# Patient Record
Sex: Female | Born: 1945 | Race: Asian | Hispanic: No | Marital: Single | Smoking: Never smoker
Health system: Southern US, Community
[De-identification: ages and names within clinical notes are randomized; demographics above are authoritative.]

## PROBLEM LIST (undated history)

## (undated) DIAGNOSIS — K219 Gastro-esophageal reflux disease without esophagitis: Secondary | ICD-10-CM

## (undated) DIAGNOSIS — I10 Essential (primary) hypertension: Secondary | ICD-10-CM

## (undated) HISTORY — PX: ABDOMINAL HYSTERECTOMY: SHX81

## (undated) HISTORY — PX: ECTOPIC PREGNANCY SURGERY: SHX613

## (undated) HISTORY — PX: BOWEL RESECTION: SHX1257

---

## 2014-04-23 ENCOUNTER — Ambulatory Visit: Payer: Self-pay | Admitting: Physician Assistant

## 2014-04-27 LAB — WOUND CULTURE

## 2016-12-27 DIAGNOSIS — D122 Benign neoplasm of ascending colon: Secondary | ICD-10-CM | POA: Diagnosis not present

## 2016-12-27 DIAGNOSIS — D126 Benign neoplasm of colon, unspecified: Secondary | ICD-10-CM | POA: Diagnosis not present

## 2016-12-27 DIAGNOSIS — K5730 Diverticulosis of large intestine without perforation or abscess without bleeding: Secondary | ICD-10-CM | POA: Diagnosis not present

## 2016-12-27 DIAGNOSIS — Z1211 Encounter for screening for malignant neoplasm of colon: Secondary | ICD-10-CM | POA: Diagnosis not present

## 2016-12-27 DIAGNOSIS — K621 Rectal polyp: Secondary | ICD-10-CM | POA: Diagnosis not present

## 2016-12-27 DIAGNOSIS — Z1212 Encounter for screening for malignant neoplasm of rectum: Secondary | ICD-10-CM | POA: Diagnosis not present

## 2016-12-27 DIAGNOSIS — K640 First degree hemorrhoids: Secondary | ICD-10-CM | POA: Diagnosis not present

## 2016-12-27 DIAGNOSIS — D123 Benign neoplasm of transverse colon: Secondary | ICD-10-CM | POA: Diagnosis not present

## 2017-04-02 ENCOUNTER — Encounter: Payer: Self-pay | Admitting: *Deleted

## 2017-04-02 ENCOUNTER — Ambulatory Visit
Admission: EM | Admit: 2017-04-02 | Discharge: 2017-04-02 | Disposition: A | Discharge status: Home / Self Care | Payer: Medicare Other | Attending: Family Medicine | Admitting: Family Medicine

## 2017-04-02 DIAGNOSIS — L231 Allergic contact dermatitis due to adhesives: Secondary | ICD-10-CM | POA: Diagnosis not present

## 2017-04-02 HISTORY — DX: Essential (primary) hypertension: I10

## 2017-04-02 MED ORDER — PREDNISONE 20 MG PO TABS: ORAL_TABLET | ORAL | 0 refills | Status: DC

## 2017-04-02 NOTE — ED Provider Notes (Signed)
[facility]    CSN: [id number] Arrival date & time: 04/02/17  1742     History   Chief Complaint Chief Complaint  Patient presents with  . Rash    HPI Hailey Mann is a 71 y.o. female.   The history is provided by the patient.  Rash  Location:  Torso and head/neck Head/neck rash location: posterior neck. Torso rash location:  Lower back Quality: redness and scaling   Severity:  Moderate Onset quality:  Sudden Duration:  4 days Timing:  Constant Progression:  Worsening Chronicity:  New Context: chemical exposure (symptoms started on lower back after she had used some "pain patches" she bought at the dollar store for her low back pain)   Context: not animal contact, not diapers, not eggs, not exposure to similar rash, not food, not hot tub use, not insect bite/sting, not medications, not new detergent/soap, not nuts, not plant contact, not pollen, not pregnancy, not sick contacts and not sun exposure   Relieved by:  Antihistamines (benadryl slightly) Worsened by:  Nothing Associated symptoms: no abdominal pain, no diarrhea, no fatigue, no fever, no headaches, no hoarse voice, no induration, no joint pain, no myalgias, no nausea, no periorbital edema, no shortness of breath, no sore throat, no throat swelling, no tongue swelling, no URI, not vomiting and not wheezing     Past Medical History:  Diagnosis Date  . Hypertension     There are no active problems to display for this patient.   History reviewed. No pertinent surgical history.  OB History    No data available       Home Medications    Prior to Admission medications   Medication Sig Start Date End Date Taking? Authorizing Provider  lisinopril-hydrochlorothiazide (PRINZIDE,ZESTORETIC) 10-12.5 MG tablet Take 1 tablet by mouth daily.   Yes Historical Provider, MD  verapamil (CALAN) 40 MG tablet Take 40 mg by mouth 3 (three) times daily.   Yes Historical Provider, MD  predniSONE (DELTASONE) 20  MG tablet 2 tabs po qd for 3 days, then 1 tab po qd for 3 days, then half a tab po qd for 3 days 04/02/17   Norval Gable, MD    Family History History reviewed. No pertinent family history.  Social History Social History  Substance Use Topics  . Smoking status: Never Smoker  . Smokeless tobacco: Never Used  . Alcohol use No     Allergies   Doxycycline; Neurontin [gabapentin]; and Codeine   Review of Systems Review of Systems  Constitutional: Negative for fatigue and fever.  HENT: Negative for hoarse voice and sore throat.   Respiratory: Negative for shortness of breath and wheezing.   Gastrointestinal: Negative for abdominal pain, diarrhea, nausea and vomiting.  Musculoskeletal: Negative for arthralgias and myalgias.  Skin: Positive for rash.  Neurological: Negative for headaches.     Physical Exam Triage Vital Signs ED Triage Vitals  Enc Vitals Group     BP 04/02/17 1919 (!) 159/85     Pulse Rate 04/02/17 1919 63     Resp 04/02/17 1919 16     Temp 04/02/17 1919 98.2 F (36.8 C)     Temp Source 04/02/17 1919 Oral     SpO2 04/02/17 1919 100 %     Weight 04/02/17 1922 205 lb (93 kg)     Height 04/02/17 1922 5\' 6"  (1.676 m)     Head Circumference --      Peak Flow --  Pain Score --      Pain Loc --      Pain Edu? --      Excl. in [place]? --    No data found.   Updated Vital Signs BP (!) 159/85 (BP Location: Left Arm)   Pulse 63   Temp 98.2 F (36.8 C) (Oral)   Resp 16   Ht 5\' 6"  (1.676 m)   Wt 205 lb (93 kg)   SpO2 100%   BMI 33.09 kg/m   Visual Acuity Right Eye Distance:   Left Eye Distance:   Bilateral Distance:    Right Eye Near:   Left Eye Near:    Bilateral Near:     Physical Exam  Constitutional: She appears well-developed and well-nourished. No distress.  Pulmonary/Chest: Effort normal and breath sounds normal. No respiratory distress. She has no wheezes.  Skin: Rash noted. She is not diaphoretic. There is erythema.  Scaly, slightly  erythematous patchy rash to lower back area and smaller similar rash on back of neck; no drainage, no vesicular lesion, no burrows  Nursing note and vitals reviewed.    UC Treatments / Results  Labs (all labs ordered are listed, but only abnormal results are displayed) Labs Reviewed - No data to display  EKG  EKG Interpretation None       Radiology No results found.  Procedures Procedures (including critical care time)  Medications Ordered in UC Medications - No data to display   Initial Impression / Assessment and Plan / UC Course  I have reviewed the triage vital signs and the nursing notes.  Pertinent labs & imaging results that were available during my care of the patient were reviewed by me and considered in my medical decision making (see chart for details).       Final Clinical Impressions(s) / UC Diagnoses   Final diagnoses:  Allergic contact dermatitis due to adhesives    New Prescriptions Discharge Medication List as of 04/02/2017  7:50 PM    START taking these medications   Details  predniSONE (DELTASONE) 20 MG tablet 2 tabs po qd for 3 days, then 1 tab po qd for 3 days, then half a tab po qd for 3 days, Normal       1. diagnosis reviewed with patient 2. rx as per orders above; reviewed possible side effects, interactions, risks and benefits  3. Recommend supportive treatment with otc benadryl prn itching  4. Follow-up prn if symptoms worsen or don't improve   Norval Gable, MD 04/02/17 2118

## 2017-04-02 NOTE — ED Triage Notes (Signed)
Rash to posterior neck and back x 4 days. Intense itching and pt concerned it may be spreading.

## 2017-04-22 DIAGNOSIS — I10 Essential (primary) hypertension: Secondary | ICD-10-CM | POA: Diagnosis not present

## 2017-04-22 DIAGNOSIS — N3946 Mixed incontinence: Secondary | ICD-10-CM | POA: Diagnosis not present

## 2017-04-22 DIAGNOSIS — R7303 Prediabetes: Secondary | ICD-10-CM | POA: Diagnosis not present

## 2017-04-22 DIAGNOSIS — Z23 Encounter for immunization: Secondary | ICD-10-CM | POA: Diagnosis not present

## 2017-04-22 DIAGNOSIS — K219 Gastro-esophageal reflux disease without esophagitis: Secondary | ICD-10-CM | POA: Diagnosis not present

## 2017-04-22 DIAGNOSIS — J4520 Mild intermittent asthma, uncomplicated: Secondary | ICD-10-CM | POA: Diagnosis not present

## 2017-04-22 DIAGNOSIS — Z8739 Personal history of other diseases of the musculoskeletal system and connective tissue: Secondary | ICD-10-CM | POA: Diagnosis not present

## 2017-04-22 DIAGNOSIS — E559 Vitamin D deficiency, unspecified: Secondary | ICD-10-CM | POA: Diagnosis not present

## 2017-05-23 DIAGNOSIS — N3946 Mixed incontinence: Secondary | ICD-10-CM | POA: Diagnosis not present

## 2017-05-23 DIAGNOSIS — Z780 Asymptomatic menopausal state: Secondary | ICD-10-CM | POA: Diagnosis not present

## 2017-05-23 DIAGNOSIS — E559 Vitamin D deficiency, unspecified: Secondary | ICD-10-CM | POA: Diagnosis not present

## 2017-05-23 DIAGNOSIS — Z1231 Encounter for screening mammogram for malignant neoplasm of breast: Secondary | ICD-10-CM | POA: Diagnosis not present

## 2017-05-23 DIAGNOSIS — I10 Essential (primary) hypertension: Secondary | ICD-10-CM | POA: Diagnosis not present

## 2017-07-25 DIAGNOSIS — Z13820 Encounter for screening for osteoporosis: Secondary | ICD-10-CM | POA: Diagnosis not present

## 2017-07-25 DIAGNOSIS — Z780 Asymptomatic menopausal state: Secondary | ICD-10-CM | POA: Diagnosis not present

## 2017-07-25 DIAGNOSIS — Z1231 Encounter for screening mammogram for malignant neoplasm of breast: Secondary | ICD-10-CM | POA: Diagnosis not present

## 2017-07-25 DIAGNOSIS — M8589 Other specified disorders of bone density and structure, multiple sites: Secondary | ICD-10-CM | POA: Diagnosis not present

## 2017-11-28 DIAGNOSIS — N3946 Mixed incontinence: Secondary | ICD-10-CM | POA: Diagnosis not present

## 2017-11-28 DIAGNOSIS — Z13220 Encounter for screening for lipoid disorders: Secondary | ICD-10-CM | POA: Diagnosis not present

## 2017-11-28 DIAGNOSIS — E559 Vitamin D deficiency, unspecified: Secondary | ICD-10-CM | POA: Diagnosis not present

## 2017-11-28 DIAGNOSIS — D649 Anemia, unspecified: Secondary | ICD-10-CM | POA: Diagnosis not present

## 2017-11-28 DIAGNOSIS — I10 Essential (primary) hypertension: Secondary | ICD-10-CM | POA: Diagnosis not present

## 2017-11-28 DIAGNOSIS — E785 Hyperlipidemia, unspecified: Secondary | ICD-10-CM | POA: Diagnosis not present

## 2017-11-28 DIAGNOSIS — R1013 Epigastric pain: Secondary | ICD-10-CM | POA: Diagnosis not present

## 2017-11-28 DIAGNOSIS — Z6836 Body mass index (BMI) 36.0-36.9, adult: Secondary | ICD-10-CM | POA: Diagnosis not present

## 2018-01-16 ENCOUNTER — Other Ambulatory Visit: Payer: Self-pay

## 2018-01-16 ENCOUNTER — Ambulatory Visit: Payer: Medicare Other

## 2018-01-16 ENCOUNTER — Ambulatory Visit
Admission: EM | Admit: 2018-01-16 | Discharge: 2018-01-16 | Disposition: A | Discharge status: Home / Self Care | Payer: Medicare Other | Attending: Emergency Medicine | Admitting: Emergency Medicine

## 2018-01-16 DIAGNOSIS — Z885 Allergy status to narcotic agent status: Secondary | ICD-10-CM | POA: Insufficient documentation

## 2018-01-16 DIAGNOSIS — M5136 Other intervertebral disc degeneration, lumbar region: Secondary | ICD-10-CM | POA: Diagnosis not present

## 2018-01-16 DIAGNOSIS — S73102A Unspecified sprain of left hip, initial encounter: Secondary | ICD-10-CM | POA: Diagnosis not present

## 2018-01-16 DIAGNOSIS — Z8719 Personal history of other diseases of the digestive system: Secondary | ICD-10-CM | POA: Diagnosis not present

## 2018-01-16 DIAGNOSIS — W010XXA Fall on same level from slipping, tripping and stumbling without subsequent striking against object, initial encounter: Secondary | ICD-10-CM | POA: Diagnosis not present

## 2018-01-16 DIAGNOSIS — M25552 Pain in left hip: Secondary | ICD-10-CM | POA: Diagnosis not present

## 2018-01-16 DIAGNOSIS — W19XXXA Unspecified fall, initial encounter: Secondary | ICD-10-CM | POA: Diagnosis not present

## 2018-01-16 DIAGNOSIS — I2510 Atherosclerotic heart disease of native coronary artery without angina pectoris: Secondary | ICD-10-CM | POA: Diagnosis not present

## 2018-01-16 DIAGNOSIS — S79912A Unspecified injury of left hip, initial encounter: Secondary | ICD-10-CM | POA: Diagnosis not present

## 2018-01-16 DIAGNOSIS — I10 Essential (primary) hypertension: Secondary | ICD-10-CM | POA: Insufficient documentation

## 2018-01-16 DIAGNOSIS — Z888 Allergy status to other drugs, medicaments and biological substances status: Secondary | ICD-10-CM | POA: Insufficient documentation

## 2018-01-16 DIAGNOSIS — K219 Gastro-esophageal reflux disease without esophagitis: Secondary | ICD-10-CM | POA: Insufficient documentation

## 2018-01-16 DIAGNOSIS — M545 Low back pain: Secondary | ICD-10-CM | POA: Diagnosis not present

## 2018-01-16 HISTORY — DX: Gastro-esophageal reflux disease without esophagitis: K21.9

## 2018-01-16 MED ORDER — METHOCARBAMOL 750 MG PO TABS: 750.0000 mg | ORAL_TABLET | ORAL | 0 refills | Status: DC

## 2018-01-16 NOTE — ED Provider Notes (Signed)
HPI  SUBJECTIVE:  Hailey Mann is a 72 y.o. female who presents with a slip and fall 4 days ago.  Patient states that she slipped on some mud, her left leg hyperextended she fell onto her left hip buttock.  She states that she had sharp low back pain after the fall, but that has since resolved.  She reports continued back pain but states it is not really new or different beyond her baseline.  She comes in for constant burning, left hip pain described as spasms which radiates into her groin.  She denies chest pain, shortness breath, palpitations, syncope causing the fall.  She denies head injury, neck pain.  She has been ambulatory since the fall.  She denies limitation of motion of her hip, numbness, tingling, weakness, sciatic type symptoms.  No bruising, swelling.  She has tried Tylenol and baclofen without improvement in her symptoms.  Symptoms are worse with initiating movement and with sitting for prolonged periods of time, better with walking.  She has a past medical history  Of lumbar degenerative disc disease, bleeding gastric ulcers, hypertension, coronary artery disease.  No history of osteoporosis, diabetes, cancer, multiple myeloma.  PMD: [facility] in [place], [place].    Past Medical History:  Diagnosis Date  . GERD (gastroesophageal reflux disease)   . Hypertension     Past Surgical History:  Procedure Laterality Date  . ABDOMINAL HYSTERECTOMY    . BOWEL RESECTION    . ECTOPIC PREGNANCY SURGERY      Family History  Problem Relation Age of Onset  . Gout Mother   . Kidney disease Father     Social History   Tobacco Use  . Smoking status: Never Smoker  . Smokeless tobacco: Never Used  Substance Use Topics  . Alcohol use: No  . Drug use: No    No current facility-administered medications for this encounter.   Current Outpatient Medications:  .  lisinopril-hydrochlorothiazide (PRINZIDE,ZESTORETIC) 10-12.5 MG tablet, Take 1 tablet by mouth daily., Disp: ,  Rfl:  .  methocarbamol (ROBAXIN) 750 MG tablet, Take 1 tablet (750 mg total) by mouth every 4 (four) hours., Disp: 40 tablet, Rfl: 0 .  verapamil (CALAN) 40 MG tablet, Take 40 mg by mouth 3 (three) times daily., Disp: , Rfl:   Allergies  Allergen Reactions  . Doxycycline Anaphylaxis  . Neurontin [Gabapentin] Other (See Comments)    Heavy sweating, tremors.  . Codeine Rash    With higher doses.     ROS  As noted in HPI.   Physical Exam  BP (!) 153/91 (BP Location: Left Arm)   Pulse 74   Temp 98.2 F (36.8 C) (Oral)   Resp 18   Ht _0  (1.6 m)   Wt 205 lb (93 kg)   SpO2 99%   BMI 36.31 kg/m   Constitutional: Well developed, well nourished, no acute distress Eyes:  EOMI, conjunctiva normal bilaterally HENT: Normocephalic, atraumatic,mucus membranes moist Respiratory: Normal inspiratory effort Cardiovascular: Normal rate GI: nondistended skin: No rash, skin intact Musculoskeletal: Positive L-spine bony tenderness at L4, L5.  No paralumbar tenderness.  No SI joint or sacral tenderness.  No signs of trauma L hip. No bruising, erythema, rash. Diffuse muscular tenderness over gluteal muscles. No tenderness, down IT band, quadriceps. No pain with passive abduction/adduction of leg. No pain with int/ext rotation hip.  Pain with forward flexion.  No pain with extension.  No tenderness at sciatic notch. Roll test for muscle spasm negative. Flexion/extension knee  WNL. Knee joint NT. Motor strenght flexion/ext hip 5/5. Sensation to LT intact. DP 2+ she has an antalgic gait but is able to bear weight on this hip. Neurologic: Alert & oriented x 3, no focal neuro deficits Psychiatric: Speech and behavior appropriate   ED Course   Medications - No data to display  Orders Placed This Encounter  Procedures  . DG Lumbar Spine Complete    Standing Status:   Standing    Number of Occurrences:   1    Order Specific Question:   Reason for Exam (SYMPTOM  OR DIAGNOSIS REQUIRED)    Answer:    r/o fx acute changes  . DG Hip Unilat With Pelvis 2-3 Views Left    Standing Status:   Standing    Number of Occurrences:   1    Order Specific Question:   Symptom/Reason for Exam    Answer:   Fall [[id number]]    Order Specific Question:   Radiology Contrast Protocol - do NOT remove file path    Answer:   [email or web address]    No results found for this or any previous visit (from the past 24 hour(s)). Dg Lumbar Spine Complete  Result Date: 01/16/2018 CLINICAL DATA:  Patient fell.  Low back pain. EXAM: LUMBAR SPINE - COMPLETE 4+ VIEW COMPARISON:  None. FINDINGS: Grade 1 anterolisthesis of L4 versus L5. Multilevel degenerative changes. There is an inferior osteophyte off of T12 consistent with degenerative change. No loss of height of T12 seen on the lateral view. There does appear to be mild loss of height to the side of T12 on the frontal view. No other acute abnormalities. IMPRESSION: 1. Mild loss of height to the right side of T12 only seen on the frontal view and not confirmed on the lateral view is an age-indeterminate finding. The finding may be nonacute. CT or MRI could better evaluate this level if there is clinical concern in this region. Electronically Signed   By: Dorise Bullion III M.D   On: 01/16/2018 18:35   Dg Hip Unilat With Pelvis 2-3 Views Left  Result Date: 01/16/2018 CLINICAL DATA:  Pain after fall several days ago EXAM: DG HIP (WITH OR WITHOUT PELVIS) 2-3V LEFT COMPARISON:  None. FINDINGS: There is no evidence of hip fracture or dislocation. There is no evidence of arthropathy or other focal bone abnormality. IMPRESSION: Negative. Electronically Signed   By: Dorise Bullion III M.D   On: 01/16/2018 18:35    ED Clinical Impression  Sprain of left hip, initial encounter  Fall - Plan: DG Hip Unilat With Pelvis 2-3 Views Left, DG Hip Unilat With Pelvis 2-3 Views Left  Fall, initial encounter   ED Assessment/Plan  Suspect hip  contusion with hip strain.  However she does have bony lumbar tenderness so we will get films to rule out an acute lumbar fracture.  We will also get hip/pelvis films as she does have tenderness over the greater trochanter/gluteal muscles.  However doubt displaced fracture or dislocation.  Reviewed imaging independently.  No acute changes in the L-spine or hip.  Mild loss of height to the right side of T12 seen on the frontal view, age indeterminate finding.  Per radiology.  See radiology report for details.  She does not have tenderness in this area so I am not concerned for any acute changes here today.    Plan to send home with 1 g of Tylenol 3-4 times a day as needed for pain, Robaxin instead of baclofen.  Patient declined prescription of Norco.  We will have her follow-up with her primary care physician as needed, to the ER if she gets worse.  Discussed  imaging, MDM, plan and followup with patient. Discussed sn/sx that should prompt return to the ED. patient agrees with plan.   Meds ordered this encounter  Medications  . methocarbamol (ROBAXIN) 750 MG tablet    Sig: Take 1 tablet (750 mg total) by mouth every 4 (four) hours.    Dispense:  40 tablet    Refill:  0    *This clinic note was created using [employer]. Therefore, there may be occasional mistakes despite careful proofreading.   ?   Melynda Ripple, MD 01/16/18 [phone]

## 2018-01-16 NOTE — Discharge Instructions (Signed)
1 g of Tylenol 3-4 times a day as needed for pain.  Try the Robaxin instead of the baclofen.  X-rays were negative for any acute fractures today.

## 2018-01-16 NOTE — ED Triage Notes (Signed)
Pt was working in the yard on Monday and injured her left low back/left hip area. Pain now into groin area and left hip/buttock. Pain 8/10 with limping gait.

## 2018-01-22 ENCOUNTER — Telehealth: Payer: Self-pay | Admitting: Emergency Medicine

## 2018-01-22 MED ORDER — METHOCARBAMOL 750 MG PO TABS: 750.0000 mg | ORAL_TABLET | ORAL | 0 refills | Status: DC

## 2018-02-25 DIAGNOSIS — M62830 Muscle spasm of back: Secondary | ICD-10-CM | POA: Diagnosis not present

## 2018-02-25 DIAGNOSIS — I10 Essential (primary) hypertension: Secondary | ICD-10-CM | POA: Diagnosis not present

## 2018-02-25 DIAGNOSIS — I517 Cardiomegaly: Secondary | ICD-10-CM | POA: Diagnosis not present

## 2018-02-25 DIAGNOSIS — R110 Nausea: Secondary | ICD-10-CM | POA: Diagnosis not present

## 2018-02-25 DIAGNOSIS — Z87891 Personal history of nicotine dependence: Secondary | ICD-10-CM | POA: Diagnosis not present

## 2018-02-25 DIAGNOSIS — E785 Hyperlipidemia, unspecified: Secondary | ICD-10-CM | POA: Diagnosis not present

## 2018-02-25 DIAGNOSIS — I440 Atrioventricular block, first degree: Secondary | ICD-10-CM | POA: Diagnosis not present

## 2018-02-25 DIAGNOSIS — Z7982 Long term (current) use of aspirin: Secondary | ICD-10-CM | POA: Diagnosis not present

## 2018-02-25 DIAGNOSIS — H8149 Vertigo of central origin, unspecified ear: Secondary | ICD-10-CM | POA: Diagnosis not present

## 2018-02-25 DIAGNOSIS — J45909 Unspecified asthma, uncomplicated: Secondary | ICD-10-CM | POA: Diagnosis not present

## 2018-02-25 DIAGNOSIS — R109 Unspecified abdominal pain: Secondary | ICD-10-CM | POA: Diagnosis not present

## 2018-02-25 DIAGNOSIS — R531 Weakness: Secondary | ICD-10-CM | POA: Diagnosis not present

## 2018-02-25 DIAGNOSIS — Z5181 Encounter for therapeutic drug level monitoring: Secondary | ICD-10-CM | POA: Diagnosis not present

## 2018-02-25 DIAGNOSIS — E119 Type 2 diabetes mellitus without complications: Secondary | ICD-10-CM | POA: Diagnosis not present

## 2018-02-25 DIAGNOSIS — Z79899 Other long term (current) drug therapy: Secondary | ICD-10-CM | POA: Diagnosis not present

## 2018-03-02 DIAGNOSIS — R937 Abnormal findings on diagnostic imaging of other parts of musculoskeletal system: Secondary | ICD-10-CM | POA: Diagnosis not present

## 2018-03-02 DIAGNOSIS — M5432 Sciatica, left side: Secondary | ICD-10-CM | POA: Diagnosis not present

## 2018-03-02 DIAGNOSIS — Y92009 Unspecified place in unspecified non-institutional (private) residence as the place of occurrence of the external cause: Secondary | ICD-10-CM | POA: Diagnosis not present

## 2018-03-02 DIAGNOSIS — W19XXXD Unspecified fall, subsequent encounter: Secondary | ICD-10-CM | POA: Diagnosis not present

## 2018-03-16 DIAGNOSIS — M5432 Sciatica, left side: Secondary | ICD-10-CM | POA: Diagnosis not present

## 2018-03-16 DIAGNOSIS — R937 Abnormal findings on diagnostic imaging of other parts of musculoskeletal system: Secondary | ICD-10-CM | POA: Diagnosis not present

## 2018-03-16 DIAGNOSIS — M47816 Spondylosis without myelopathy or radiculopathy, lumbar region: Secondary | ICD-10-CM | POA: Diagnosis not present

## 2018-09-22 NOTE — Telephone Encounter (Signed)
To close

## 2018-11-27 ENCOUNTER — Other Ambulatory Visit: Payer: Self-pay

## 2018-11-27 ENCOUNTER — Ambulatory Visit
Admission: EM | Admit: 2018-11-27 | Discharge: 2018-11-27 | Disposition: A | Discharge status: Home / Self Care | Payer: Medicare Other | Attending: Family Medicine | Admitting: Family Medicine

## 2018-11-27 ENCOUNTER — Encounter: Payer: Self-pay | Admitting: Emergency Medicine

## 2018-11-27 DIAGNOSIS — B9789 Other viral agents as the cause of diseases classified elsewhere: Secondary | ICD-10-CM | POA: Diagnosis not present

## 2018-11-27 DIAGNOSIS — J069 Acute upper respiratory infection, unspecified: Secondary | ICD-10-CM | POA: Diagnosis not present

## 2018-11-27 DIAGNOSIS — J45901 Unspecified asthma with (acute) exacerbation: Secondary | ICD-10-CM | POA: Insufficient documentation

## 2018-11-27 MED ORDER — ALBUTEROL SULFATE HFA 108 (90 BASE) MCG/ACT IN AERS: 1.0000 | INHALATION_SPRAY | Freq: Four times a day (QID) | RESPIRATORY_TRACT | 0 refills | Status: DC | PRN

## 2018-11-27 MED ORDER — BENZONATATE 100 MG PO CAPS: 100.0000 mg | ORAL_CAPSULE | Freq: Three times a day (TID) | ORAL | 0 refills | Status: DC | PRN

## 2018-11-27 MED ORDER — PREDNISONE 50 MG PO TABS: ORAL_TABLET | ORAL | 0 refills | Status: DC

## 2018-11-27 NOTE — ED Triage Notes (Signed)
Patient c/o cough and congestion that started on Monday. Patient states she has left side rib pain with coughing and deep inspiration. Patient denies fever. States she has taken OTC Theraflu and Dayquil for her symptoms.

## 2018-11-27 NOTE — Discharge Instructions (Signed)
Meds as prescribed. ° °Take care ° °Dr. Ivie Maese  °

## 2018-11-27 NOTE — ED Provider Notes (Signed)
[facility]    CSN: [id number] Arrival date & time: 11/27/18  4315  History   Chief Complaint Chief Complaint  Patient presents with  . Cough  . Muscle Pain   HPI   72 year old female presents with the above complaint.  Patient reports that she cough and congestion on Monday.  Has a history of asthma.  She reports associated left lower rib pain.  Pain with deep inspiration.  No fever.  No chills.  She has been taking over-the-counter medication without resolution.  Symptoms are worse at night.  No known relieving factors.  No other associated symptoms.  PMH, Surgical Hx, Family Hx, Social History reviewed and updated as below.  PMH: Hyperlipidemia LDL goal <130, unspecified 12/02/2017  Overview:   Formatting of this note might be different from the original. Previously on pravastatin, unclear with d/c Restarted 12/02/17 for ASCVD risk of 13.9%  Last Lipids: Lab Results  Component Value Date  CHOLTOTAL 208 11/28/2017  [place] 132 11/28/2017  HDL 65 11/28/2017  TRIG 56 11/28/2017     Anemia 12/02/2017  BMI 36.0-36.9,adult 11/28/2017  Intermittent epigastric abdominal pain 11/28/2017  Mild intermittent asthma without complication 40/07/6760  Tubular adenoma 04/22/2017  Pre-diabetes 04/22/2017  Urge and stress incontinence 04/22/2017  Vitamin D insufficiency 04/22/2017  History of low back pain 04/22/2017  Essential hypertension 06/08/2015  Generalized osteoarthritis 06/08/2015  GERD without esophagitis 06/08/2015  Chest pain 06/07/2015    Past Surgical History:  Procedure Laterality Date  . ABDOMINAL HYSTERECTOMY    . BOWEL RESECTION    . ECTOPIC PREGNANCY SURGERY      OB History   No obstetric history on file.      Home Medications    Prior to Admission medications   Medication Sig Start Date End Date Taking? Authorizing Provider  lisinopril-hydrochlorothiazide (PRINZIDE,ZESTORETIC) 10-12.5 MG tablet Take 1 tablet by mouth daily.   Yes  [provider]  verapamil (CALAN) 40 MG tablet Take 40 mg by mouth 3 (three) times daily.   Yes [provider]  albuterol (PROVENTIL HFA;VENTOLIN HFA) 108 (90 Base) MCG/ACT inhaler Inhale 1-2 puffs into the lungs every 6 (six) hours as needed for wheezing or shortness of breath. 11/27/18   Coral Spikes, DO  benzonatate (TESSALON) 100 MG capsule Take 1 capsule (100 mg total) by mouth 3 (three) times daily as needed. 11/27/18   Coral Spikes, DO  predniSONE (DELTASONE) 50 MG tablet 1 tablet daily x 5 days 11/27/18   Coral Spikes, DO    Family History Family History  Problem Relation Age of Onset  . Gout Mother   . Kidney disease Father     Social History Social History   Tobacco Use  . Smoking status: Never Smoker  . Smokeless tobacco: Never Used  Substance Use Topics  . Alcohol use: No  . Drug use: No     Allergies   Doxycycline; Neurontin [gabapentin]; and Codeine   Review of Systems Review of Systems  Constitutional: Negative for fever.  HENT: Positive for congestion.   Respiratory: Positive for cough.     Physical Exam Triage Vital Signs ED Triage Vitals [11/27/18 1014]  Enc Vitals Group     BP (!) 177/101     Pulse Rate 75     Resp 18     Temp 98.6 F (37 C)     Temp Source Oral     SpO2 99 %     Weight 205 lb (93 kg)  Height 5\' 5"  (1.651 m)     Head Circumference      Peak Flow      Pain Score 7     Pain Loc      Pain Edu?      Excl. in [place]?    Updated Vital Signs BP (!) 177/101 (BP Location: Left Arm)   Pulse 75   Temp 98.6 F (37 C) (Oral)   Resp 18   Ht 5\' 5"  (1.651 m)   Wt 93 kg   SpO2 99%   BMI 34.11 kg/m   Visual Acuity Right Eye Distance:   Left Eye Distance:   Bilateral Distance:    Right Eye Near:   Left Eye Near:    Bilateral Near:     Physical Exam Vitals signs and nursing note reviewed.  Constitutional:      General: She is not in acute distress. HENT:     Head: Normocephalic and atraumatic.      Right Ear: Tympanic membrane normal.     Left Ear: Tympanic membrane normal.     Mouth/Throat:     Pharynx: Oropharynx is clear. No posterior oropharyngeal erythema.  Eyes:     General:        Right eye: No discharge.        Left eye: No discharge.     Conjunctiva/sclera: Conjunctivae normal.  Cardiovascular:     Rate and Rhythm: Normal rate and regular rhythm.  Pulmonary:     Effort: Pulmonary effort is normal. No respiratory distress.     Breath sounds: Wheezing present.  Neurological:     Mental Status: She is alert.  Psychiatric:        Mood and Affect: Mood normal.        Behavior: Behavior normal.    UC Treatments / Results  Labs (all labs ordered are listed, but only abnormal results are displayed) Labs Reviewed - No data to display  EKG None  Radiology No results found.  Procedures Procedures (including critical care time)  Medications Ordered in UC Medications - No data to display  Initial Impression / Assessment and Plan / UC Course  I have reviewed the triage vital signs and the nursing notes.  Pertinent labs & imaging results that were available during my care of the patient were reviewed by me and considered in my medical decision making (see chart for details).    72 year old female presents with a viral URI with cough.  This has led to a slight asthma exacerbation.  Treating with albuterol, prednisone, Tessalon Perles.  Final Clinical Impressions(s) / UC Diagnoses   Final diagnoses:  Viral URI with cough  Asthma with acute exacerbation, unspecified asthma severity, unspecified whether persistent     Discharge Instructions     Meds as prescribed.  Take care  Dr. Lacinda Axon   ED Prescriptions    Medication Sig Dispense Auth. Provider   predniSONE (DELTASONE) 50 MG tablet 1 tablet daily x 5 days 5 tablet [place] Dagher G, DO   benzonatate (TESSALON) 100 MG capsule Take 1 capsule (100 mg total) by mouth 3 (three) times daily as needed. 30 capsule  Roshanda Balazs G, DO   albuterol (PROVENTIL HFA;VENTOLIN HFA) 108 (90 Base) MCG/ACT inhaler Inhale 1-2 puffs into the lungs every 6 (six) hours as needed for wheezing or shortness of breath. [place], DO     Controlled Substance Prescriptions [place] Controlled Substance Registry consulted? Not Applicable   Coral Spikes, DO  11/27/18 1119  

## 2019-05-25 DIAGNOSIS — R112 Nausea with vomiting, unspecified: Secondary | ICD-10-CM | POA: Diagnosis not present

## 2019-05-25 DIAGNOSIS — R51 Headache: Secondary | ICD-10-CM | POA: Diagnosis not present

## 2019-05-25 DIAGNOSIS — Z20828 Contact with and (suspected) exposure to other viral communicable diseases: Secondary | ICD-10-CM | POA: Diagnosis not present

## 2019-05-25 DIAGNOSIS — R5383 Other fatigue: Secondary | ICD-10-CM | POA: Diagnosis not present

## 2019-05-25 DIAGNOSIS — I517 Cardiomegaly: Secondary | ICD-10-CM | POA: Diagnosis not present

## 2019-05-25 DIAGNOSIS — R0600 Dyspnea, unspecified: Secondary | ICD-10-CM | POA: Diagnosis not present

## 2019-05-25 DIAGNOSIS — M25511 Pain in right shoulder: Secondary | ICD-10-CM | POA: Diagnosis not present

## 2019-05-25 DIAGNOSIS — I161 Hypertensive emergency: Secondary | ICD-10-CM | POA: Diagnosis not present

## 2019-05-25 DIAGNOSIS — R6883 Chills (without fever): Secondary | ICD-10-CM | POA: Diagnosis not present

## 2019-05-25 DIAGNOSIS — Z87891 Personal history of nicotine dependence: Secondary | ICD-10-CM | POA: Diagnosis not present

## 2019-05-25 DIAGNOSIS — I10 Essential (primary) hypertension: Secondary | ICD-10-CM | POA: Diagnosis not present

## 2019-05-25 DIAGNOSIS — M791 Myalgia, unspecified site: Secondary | ICD-10-CM | POA: Diagnosis not present

## 2019-05-27 DIAGNOSIS — Z87898 Personal history of other specified conditions: Secondary | ICD-10-CM | POA: Diagnosis not present

## 2019-05-27 DIAGNOSIS — I10 Essential (primary) hypertension: Secondary | ICD-10-CM | POA: Diagnosis not present

## 2019-06-07 DIAGNOSIS — R7303 Prediabetes: Secondary | ICD-10-CM | POA: Diagnosis not present

## 2019-06-07 DIAGNOSIS — G8929 Other chronic pain: Secondary | ICD-10-CM | POA: Diagnosis not present

## 2019-06-07 DIAGNOSIS — E559 Vitamin D deficiency, unspecified: Secondary | ICD-10-CM | POA: Diagnosis not present

## 2019-06-07 DIAGNOSIS — E785 Hyperlipidemia, unspecified: Secondary | ICD-10-CM | POA: Diagnosis not present

## 2019-06-07 DIAGNOSIS — I10 Essential (primary) hypertension: Secondary | ICD-10-CM | POA: Diagnosis not present

## 2019-06-07 DIAGNOSIS — N3941 Urge incontinence: Secondary | ICD-10-CM | POA: Diagnosis not present

## 2019-06-07 DIAGNOSIS — J4520 Mild intermittent asthma, uncomplicated: Secondary | ICD-10-CM | POA: Diagnosis not present

## 2019-06-07 DIAGNOSIS — Z5329 Procedure and treatment not carried out because of patient's decision for other reasons: Secondary | ICD-10-CM | POA: Diagnosis not present

## 2019-06-07 DIAGNOSIS — M545 Low back pain: Secondary | ICD-10-CM | POA: Diagnosis not present

## 2019-06-07 DIAGNOSIS — Z87898 Personal history of other specified conditions: Secondary | ICD-10-CM | POA: Diagnosis not present

## 2019-08-16 DIAGNOSIS — Z0000 Encounter for general adult medical examination without abnormal findings: Secondary | ICD-10-CM | POA: Diagnosis not present

## 2019-08-16 DIAGNOSIS — Z23 Encounter for immunization: Secondary | ICD-10-CM | POA: Diagnosis not present

## 2019-08-16 DIAGNOSIS — R3989 Other symptoms and signs involving the genitourinary system: Secondary | ICD-10-CM | POA: Diagnosis not present

## 2019-10-18 DIAGNOSIS — M79601 Pain in right arm: Secondary | ICD-10-CM | POA: Diagnosis not present

## 2019-10-18 DIAGNOSIS — Z79899 Other long term (current) drug therapy: Secondary | ICD-10-CM | POA: Diagnosis not present

## 2019-10-18 DIAGNOSIS — Z5181 Encounter for therapeutic drug level monitoring: Secondary | ICD-10-CM | POA: Diagnosis not present

## 2019-10-18 DIAGNOSIS — M5412 Radiculopathy, cervical region: Secondary | ICD-10-CM | POA: Diagnosis not present

## 2019-10-18 DIAGNOSIS — R202 Paresthesia of skin: Secondary | ICD-10-CM | POA: Diagnosis not present

## 2019-10-18 DIAGNOSIS — R29898 Other symptoms and signs involving the musculoskeletal system: Secondary | ICD-10-CM | POA: Diagnosis not present

## 2019-10-18 DIAGNOSIS — R531 Weakness: Secondary | ICD-10-CM | POA: Diagnosis not present

## 2019-10-18 DIAGNOSIS — M25511 Pain in right shoulder: Secondary | ICD-10-CM | POA: Diagnosis not present

## 2019-10-18 DIAGNOSIS — Z87891 Personal history of nicotine dependence: Secondary | ICD-10-CM | POA: Diagnosis not present

## 2019-10-18 DIAGNOSIS — R208 Other disturbances of skin sensation: Secondary | ICD-10-CM | POA: Diagnosis not present

## 2019-10-18 DIAGNOSIS — I6521 Occlusion and stenosis of right carotid artery: Secondary | ICD-10-CM | POA: Diagnosis not present

## 2020-10-30 DIAGNOSIS — J4520 Mild intermittent asthma, uncomplicated: Secondary | ICD-10-CM | POA: Diagnosis not present

## 2020-10-30 DIAGNOSIS — I10 Essential (primary) hypertension: Secondary | ICD-10-CM | POA: Diagnosis not present

## 2020-10-30 DIAGNOSIS — R319 Hematuria, unspecified: Secondary | ICD-10-CM | POA: Diagnosis not present

## 2020-10-30 DIAGNOSIS — Z1231 Encounter for screening mammogram for malignant neoplasm of breast: Secondary | ICD-10-CM | POA: Diagnosis not present

## 2020-10-30 DIAGNOSIS — E782 Mixed hyperlipidemia: Secondary | ICD-10-CM | POA: Diagnosis not present

## 2020-10-30 DIAGNOSIS — N3941 Urge incontinence: Secondary | ICD-10-CM | POA: Diagnosis not present

## 2020-11-12 DIAGNOSIS — S0003XA Contusion of scalp, initial encounter: Secondary | ICD-10-CM | POA: Diagnosis not present

## 2020-11-12 DIAGNOSIS — I10 Essential (primary) hypertension: Secondary | ICD-10-CM | POA: Diagnosis not present

## 2020-11-12 DIAGNOSIS — Z882 Allergy status to sulfonamides status: Secondary | ICD-10-CM | POA: Diagnosis not present

## 2020-11-12 DIAGNOSIS — E119 Type 2 diabetes mellitus without complications: Secondary | ICD-10-CM | POA: Diagnosis not present

## 2020-11-12 DIAGNOSIS — Z043 Encounter for examination and observation following other accident: Secondary | ICD-10-CM | POA: Diagnosis not present

## 2020-11-12 DIAGNOSIS — E785 Hyperlipidemia, unspecified: Secondary | ICD-10-CM | POA: Diagnosis not present

## 2020-11-12 DIAGNOSIS — F419 Anxiety disorder, unspecified: Secondary | ICD-10-CM | POA: Diagnosis not present

## 2020-11-12 DIAGNOSIS — R519 Headache, unspecified: Secondary | ICD-10-CM | POA: Diagnosis not present

## 2020-11-12 DIAGNOSIS — M50322 Other cervical disc degeneration at C5-C6 level: Secondary | ICD-10-CM | POA: Diagnosis not present

## 2020-11-12 DIAGNOSIS — M79642 Pain in left hand: Secondary | ICD-10-CM | POA: Diagnosis not present

## 2020-11-12 DIAGNOSIS — S199XXA Unspecified injury of neck, initial encounter: Secondary | ICD-10-CM | POA: Diagnosis not present

## 2020-11-12 DIAGNOSIS — M25532 Pain in left wrist: Secondary | ICD-10-CM | POA: Diagnosis not present

## 2020-11-12 DIAGNOSIS — M79602 Pain in left arm: Secondary | ICD-10-CM | POA: Diagnosis not present

## 2020-11-12 DIAGNOSIS — Z886 Allergy status to analgesic agent status: Secondary | ICD-10-CM | POA: Diagnosis not present

## 2020-11-12 DIAGNOSIS — J45909 Unspecified asthma, uncomplicated: Secondary | ICD-10-CM | POA: Diagnosis not present

## 2020-11-12 DIAGNOSIS — Z7982 Long term (current) use of aspirin: Secondary | ICD-10-CM | POA: Diagnosis not present

## 2020-11-12 DIAGNOSIS — M7989 Other specified soft tissue disorders: Secondary | ICD-10-CM | POA: Diagnosis not present

## 2020-11-12 DIAGNOSIS — Z87891 Personal history of nicotine dependence: Secondary | ICD-10-CM | POA: Diagnosis not present

## 2020-11-12 DIAGNOSIS — Z79899 Other long term (current) drug therapy: Secondary | ICD-10-CM | POA: Diagnosis not present

## 2020-11-12 DIAGNOSIS — M19042 Primary osteoarthritis, left hand: Secondary | ICD-10-CM | POA: Diagnosis not present

## 2020-11-12 DIAGNOSIS — S0083XA Contusion of other part of head, initial encounter: Secondary | ICD-10-CM | POA: Diagnosis not present

## 2020-11-12 DIAGNOSIS — Z885 Allergy status to narcotic agent status: Secondary | ICD-10-CM | POA: Diagnosis not present

## 2020-11-20 DIAGNOSIS — M79622 Pain in left upper arm: Secondary | ICD-10-CM | POA: Diagnosis not present

## 2020-11-20 DIAGNOSIS — Z23 Encounter for immunization: Secondary | ICD-10-CM | POA: Diagnosis not present

## 2020-11-20 DIAGNOSIS — W1830XA Fall on same level, unspecified, initial encounter: Secondary | ICD-10-CM | POA: Diagnosis not present

## 2020-11-22 DIAGNOSIS — S53402A Unspecified sprain of left elbow, initial encounter: Secondary | ICD-10-CM | POA: Diagnosis not present

## 2020-11-22 DIAGNOSIS — M13842 Other specified arthritis, left hand: Secondary | ICD-10-CM | POA: Diagnosis not present

## 2020-11-22 DIAGNOSIS — S46312A Strain of muscle, fascia and tendon of triceps, left arm, initial encounter: Secondary | ICD-10-CM | POA: Diagnosis not present

## 2021-10-27 ENCOUNTER — Ambulatory Visit (INDEPENDENT_AMBULATORY_CARE_PROVIDER_SITE_OTHER): Payer: Medicare Other

## 2021-10-27 ENCOUNTER — Ambulatory Visit
Admission: EM | Admit: 2021-10-27 | Discharge: 2021-10-27 | Disposition: A | Discharge status: Home / Self Care | Payer: Medicare Other | Attending: Physician Assistant | Admitting: Physician Assistant

## 2021-10-27 ENCOUNTER — Encounter: Payer: Self-pay | Admitting: Licensed Clinical Social Worker

## 2021-10-27 DIAGNOSIS — J4520 Mild intermittent asthma, uncomplicated: Secondary | ICD-10-CM | POA: Diagnosis not present

## 2021-10-27 DIAGNOSIS — K219 Gastro-esophageal reflux disease without esophagitis: Secondary | ICD-10-CM | POA: Insufficient documentation

## 2021-10-27 DIAGNOSIS — R051 Acute cough: Secondary | ICD-10-CM | POA: Diagnosis present

## 2021-10-27 DIAGNOSIS — J209 Acute bronchitis, unspecified: Secondary | ICD-10-CM | POA: Insufficient documentation

## 2021-10-27 DIAGNOSIS — Z20822 Contact with and (suspected) exposure to covid-19: Secondary | ICD-10-CM | POA: Diagnosis not present

## 2021-10-27 DIAGNOSIS — R7303 Prediabetes: Secondary | ICD-10-CM | POA: Diagnosis not present

## 2021-10-27 DIAGNOSIS — J45901 Unspecified asthma with (acute) exacerbation: Secondary | ICD-10-CM

## 2021-10-27 DIAGNOSIS — R059 Cough, unspecified: Secondary | ICD-10-CM

## 2021-10-27 DIAGNOSIS — J029 Acute pharyngitis, unspecified: Secondary | ICD-10-CM

## 2021-10-27 DIAGNOSIS — I10 Essential (primary) hypertension: Secondary | ICD-10-CM | POA: Diagnosis not present

## 2021-10-27 DIAGNOSIS — R0602 Shortness of breath: Secondary | ICD-10-CM

## 2021-10-27 DIAGNOSIS — R509 Fever, unspecified: Secondary | ICD-10-CM

## 2021-10-27 MED ORDER — PREDNISONE 20 MG PO TABS: 40.0000 mg | ORAL_TABLET | Freq: Every day | ORAL | 0 refills | Status: AC

## 2021-10-27 MED ORDER — LIDOCAINE VISCOUS HCL 2 % MT SOLN: 15.0000 mL | OROMUCOSAL | 0 refills | Status: DC | PRN

## 2021-10-27 MED ORDER — CHERATUSSIN AC 100-10 MG/5ML PO SOLN: 10.0000 mL | Freq: Three times a day (TID) | ORAL | 0 refills | Status: DC | PRN

## 2021-10-27 MED ORDER — ALBUTEROL SULFATE HFA 108 (90 BASE) MCG/ACT IN AERS: 1.0000 | INHALATION_SPRAY | Freq: Four times a day (QID) | RESPIRATORY_TRACT | 0 refills | Status: DC | PRN

## 2021-10-27 NOTE — ED Triage Notes (Signed)
Pt c/o of sore throat, congestion, cough sxs started last week. Pt went to ED last week, and tested for flu and strep, negative. SOB also

## 2021-10-27 NOTE — Discharge Instructions (Signed)
-  The chest x-ray does not show any evidence of pneumonia.  This is likely a viral illness.  The COVID test comes back tomorrow.  Someone will call you if it is positive. - Increase rest and fluid intake. - I sent inhalers to use that as needed for shortness of breath. - I have also sent a cough medication.  This is a lower dose codeine so it should be okay but if you have any reactions, please give [place] a call and make sure he stopped the medication. - We have also sent prednisone.  Hopefully that helps with the shortness of breath and asthma exacerbation as well. - If not feeling better over the next week, please follow-up with PCP.  If you develop a fever or worsening chest discomfort or shortness of breath you should go to emergency department.  If you are having more blood in the sputum that you are now and worsening symptoms he should go to emergency department.
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
## 2021-10-28 LAB — SARS CORONAVIRUS 2 (TAT 6-24 HRS): SARS Coronavirus 2: NEGATIVE

## 2022-09-16 ENCOUNTER — Ambulatory Visit (INDEPENDENT_AMBULATORY_CARE_PROVIDER_SITE_OTHER): Payer: Medicare Other

## 2022-09-16 ENCOUNTER — Ambulatory Visit
Admission: EM | Admit: 2022-09-16 | Discharge: 2022-09-16 | Disposition: A | Discharge status: Home / Self Care | Payer: Medicare Other | Attending: Emergency Medicine | Admitting: Emergency Medicine

## 2022-09-16 DIAGNOSIS — R0989 Other specified symptoms and signs involving the circulatory and respiratory systems: Secondary | ICD-10-CM | POA: Diagnosis not present

## 2022-09-16 DIAGNOSIS — R062 Wheezing: Secondary | ICD-10-CM | POA: Diagnosis not present

## 2022-09-16 DIAGNOSIS — R059 Cough, unspecified: Secondary | ICD-10-CM | POA: Diagnosis present

## 2022-09-16 DIAGNOSIS — Z1152 Encounter for screening for COVID-19: Secondary | ICD-10-CM | POA: Diagnosis not present

## 2022-09-16 DIAGNOSIS — J45909 Unspecified asthma, uncomplicated: Secondary | ICD-10-CM | POA: Insufficient documentation

## 2022-09-16 DIAGNOSIS — J069 Acute upper respiratory infection, unspecified: Secondary | ICD-10-CM | POA: Diagnosis not present

## 2022-09-16 DIAGNOSIS — R0602 Shortness of breath: Secondary | ICD-10-CM | POA: Diagnosis not present

## 2022-09-16 LAB — SARS CORONAVIRUS 2 BY RT PCR: SARS Coronavirus 2 by RT PCR: NEGATIVE

## 2022-09-16 MED ORDER — PREDNISONE 20 MG PO TABS: 60.0000 mg | ORAL_TABLET | Freq: Every day | ORAL | 0 refills | Status: AC

## 2022-09-16 MED ORDER — IPRATROPIUM BROMIDE 0.06 % NA SOLN: 2.0000 | Freq: Four times a day (QID) | NASAL | 12 refills | Status: DC

## 2022-09-16 MED ORDER — PROMETHAZINE-DM 6.25-15 MG/5ML PO SYRP: 5.0000 mL | ORAL_SOLUTION | Freq: Four times a day (QID) | ORAL | 0 refills | Status: DC | PRN

## 2022-09-16 MED ORDER — BENZONATATE 100 MG PO CAPS: 200.0000 mg | ORAL_CAPSULE | Freq: Three times a day (TID) | ORAL | 0 refills | Status: DC

## 2022-09-16 NOTE — ED Triage Notes (Signed)
Patient presents to [facility] for nasal and chest congestion, SOB, and coughing up mucus since last Wednesday.

## 2022-09-16 NOTE — Discharge Instructions (Signed)
Your chest x-ray did not demonstrate any evidence of pneumonia and your COVID test was negative.  I do believe you have a viral respiratory process.  Continue to use your albuterol inhaler with a spacer, 1 to 2 puffs every 4-6 hours, as needed for any shortness of breath or wheezing.  We can start you on a 5-day burst dose of prednisone.  You will take 60 mg of prednisone each morning at breakfast time for 5 days to decrease pulmonary inflammation and aid in your breathing.  Use the Atrovent nasal spray, 2 squirts in each nostril every 6 hours, as needed for runny nose and postnasal drip.  Use the Tessalon Perles every 8 hours during the day.  Take them with a small sip of water.  They may give you some numbness to the base of your tongue or a metallic taste in your mouth, this is normal.  Use the Promethazine DM cough syrup at bedtime for cough and congestion.  It will make you drowsy so do not take it during the day.  Return for reevaluation or see your primary care provider for any new or worsening symptoms.
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
## 2023-10-01 ENCOUNTER — Ambulatory Visit: Payer: Medicare Other

## 2023-10-01 ENCOUNTER — Ambulatory Visit
Admission: EM | Admit: 2023-10-01 | Discharge: 2023-10-01 | Disposition: A | Discharge status: Home / Self Care | Payer: Medicare Other | Attending: Physician Assistant | Admitting: Physician Assistant

## 2023-10-01 DIAGNOSIS — R051 Acute cough: Secondary | ICD-10-CM

## 2023-10-01 DIAGNOSIS — J45901 Unspecified asthma with (acute) exacerbation: Secondary | ICD-10-CM

## 2023-10-01 DIAGNOSIS — J189 Pneumonia, unspecified organism: Secondary | ICD-10-CM

## 2023-10-01 MED ORDER — LEVOFLOXACIN 750 MG PO TABS
750.0000 mg | ORAL_TABLET | Freq: Every day | ORAL | 0 refills | Status: AC
Start: 2023-10-01 — End: 2023-10-06

## 2023-10-01 MED ORDER — BENZONATATE 200 MG PO CAPS: 200.0000 mg | ORAL_CAPSULE | Freq: Three times a day (TID) | ORAL | 0 refills | Status: DC | PRN

## 2023-10-01 MED ORDER — ALBUTEROL SULFATE (2.5 MG/3ML) 0.083% IN NEBU
2.5000 mg | INHALATION_SOLUTION | Freq: Four times a day (QID) | RESPIRATORY_TRACT | 1 refills | Status: DC | PRN
Start: 2023-10-01 — End: 2023-11-27

## 2023-10-01 MED ORDER — PREDNISONE 20 MG PO TABS
40.0000 mg | ORAL_TABLET | Freq: Every day | ORAL | 0 refills | Status: AC
Start: 2023-10-01 — End: 2023-10-06

## 2023-10-01 NOTE — ED Triage Notes (Signed)
Pt c/o cough & sob x14 days. Hx of asthma. States was at [facility] ED 3 wks ago & was seen for acute cough. Has tried inhalers w/o relief.

## 2023-10-01 NOTE — Discharge Instructions (Addendum)
-  Your x-ray suspicious for pneumonia.  I sent antibiotics to the pharmacy for you as well as cough medication, albuterol for the nebulizer and prednisone. - You need to repeat this x-ray in 3 to 4 weeks to make sure everything is cleared up. - If you begin to have a fever or worsening symptoms despite being on antibiotics you should go to the ER.

## 2023-10-01 NOTE — ED Provider Notes (Signed)
[facility]    CSN: [id number] Arrival date & time: 10/01/23  1607      History   Chief Complaint Chief Complaint  Patient presents with   Cough   Shortness of Breath    HPI Hailey Mann is a 77 y.o. female with history of asthma and hypertension.  She presents today for 3 to 4-week history of cough and congestion.  Symptoms have worsened over the past 2 weeks.  She was seen in the ER 3 weeks ago at onset of symptoms and told she might have pneumonia.  She was given amoxicillin but says symptoms have continued to get worse.  Denies fever.  Has had increased shortness of breath, chest tightness, fatigue.  Cough is productive of thick whitish-yellow sputum.  Denies weakness.  Has taken prednisone 3 weeks ago in the ER.  Has been using albuterol at home without relief.  No other concerns.  HPI  Past Medical History:  Diagnosis Date   GERD (gastroesophageal reflux disease)    Hypertension     There are no problems to display for this patient.   Past Surgical History:  Procedure Laterality Date   ABDOMINAL HYSTERECTOMY     BOWEL RESECTION     ECTOPIC PREGNANCY SURGERY      OB History   No obstetric history on file.      Home Medications    Prior to Admission medications   Medication Sig Start Date End Date Taking? Authorizing Provider  albuterol (PROVENTIL) (2.5 MG/3ML) 0.083% nebulizer solution Take 3 mLs (2.5 mg total) by nebulization every 6 (six) hours as needed for wheezing or shortness of breath. 10/01/23  Yes Shirlee Latch, PA-C  aspirin EC 81 MG tablet Take 1 tablet by mouth daily.   Yes [provider]  benzonatate (TESSALON) 200 MG capsule Take 1 capsule (200 mg total) by mouth 3 (three) times daily as needed. 10/01/23  Yes Eusebio Friendly B, PA-C  budesonide-formoterol (SYMBICORT) 160-4.5 MCG/ACT inhaler Inhale into the lungs.   Yes [provider]  levofloxacin (LEVAQUIN) 750 MG tablet Take 1 tablet (750 mg total) by mouth  daily for 5 days. 10/01/23 10/06/23 Yes Eusebio Friendly B, PA-C  lisinopril (ZESTRIL) 40 MG tablet Take 1 tablet by mouth daily. 07/22/23  Yes [provider]  lisinopril-hydrochlorothiazide (PRINZIDE,ZESTORETIC) 10-12.5 MG tablet Take 1 tablet by mouth daily.   Yes [provider]  Multiple Vitamin (MULTI-VITAMIN) tablet Take 1 tablet by mouth daily.   Yes [provider]  oxybutynin (DITROPAN-XL) 10 MG 24 hr tablet Take 1 tablet by mouth daily. 10/30/20  Yes [provider]  predniSONE (DELTASONE) 20 MG tablet Take 2 tablets (40 mg total) by mouth daily for 5 days. 10/01/23 10/06/23 Yes Shirlee Latch, PA-C  verapamil (CALAN) 40 MG tablet Take 40 mg by mouth 3 (three) times daily.   Yes [provider]  albuterol (VENTOLIN HFA) 108 (90 Base) MCG/ACT inhaler Inhale 1-2 puffs into the lungs every 6 (six) hours as needed for wheezing or shortness of breath. 10/27/21   Eusebio Friendly B, PA-C  lidocaine (XYLOCAINE) 2 % solution Use as directed 15 mLs in the mouth or throat every 3 (three) hours as needed for mouth pain (swish and spit). 10/27/21   Shirlee Latch, PA-C  promethazine-dextromethorphan (PROMETHAZINE-DM) 6.25-15 MG/5ML syrup Take 5 mLs by mouth 4 (four) times daily as needed. 09/16/22   Becky Augusta, NP    Family History Family History  Problem Relation Age  of Onset   Gout Mother    Kidney disease Father     Social History Social History   Tobacco Use   Smoking status: Never   Smokeless tobacco: Never  Vaping Use   Vaping status: Never Used  Substance Use Topics   Alcohol use: No   Drug use: No     Allergies   Aspirin, Codeine, Doxycycline, Morphine, Gabapentin, Acetaminophen-codeine, and Erythromycin   Review of Systems Review of Systems  Constitutional:  Positive for fatigue. Negative for chills, diaphoresis and fever.  HENT:  Positive for congestion. Negative for ear pain, rhinorrhea, sinus pressure, sinus pain and sore  throat.   Respiratory:  Positive for cough, chest tightness, shortness of breath and wheezing.   Cardiovascular:  Negative for chest pain.  Gastrointestinal:  Negative for abdominal pain, nausea and vomiting.  Musculoskeletal:  Negative for myalgias.  Skin:  Negative for rash.  Neurological:  Negative for weakness and headaches.  Hematological:  Negative for adenopathy.     Physical Exam Triage Vital Signs ED Triage Vitals  Encounter Vitals Group     BP      Systolic BP Percentile      Diastolic BP Percentile      Pulse      Resp      Temp      Temp src      SpO2      Weight      Height      Head Circumference      Peak Flow      Pain Score      Pain Loc      Pain Education      Exclude from Growth Chart    No data found.  Updated Vital Signs BP (!) 162/106 (BP Location: Left Arm)   Pulse 95   Temp 98.2 F (36.8 C) (Oral)   Resp 16   Ht 5\' 5"  (1.651 m)   Wt 203 lb (92.1 kg)   SpO2 94%   BMI 33.78 kg/m      Physical Exam Vitals and nursing note reviewed.  Constitutional:      General: She is not in acute distress.    Appearance: Normal appearance. She is ill-appearing. She is not toxic-appearing.  HENT:     Head: Normocephalic and atraumatic.     Nose: Nose normal.     Mouth/Throat:     Mouth: Mucous membranes are moist.     Pharynx: Oropharynx is clear.  Eyes:     General: No scleral icterus.       Right eye: No discharge.        Left eye: No discharge.     Conjunctiva/sclera: Conjunctivae normal.  Cardiovascular:     Rate and Rhythm: Normal rate and regular rhythm.     Heart sounds: Normal heart sounds.  Pulmonary:     Effort: Pulmonary effort is normal. No respiratory distress.     Breath sounds: Wheezing (inspiratory and expiratory) and rales (LLL) present.  Musculoskeletal:     Cervical back: Neck supple.  Skin:    General: Skin is dry.  Neurological:     General: No focal deficit present.     Mental Status: She is alert. Mental status is  at baseline.     Motor: No weakness.     Gait: Gait normal.  Psychiatric:        Mood and Affect: Mood normal.        Behavior: Behavior normal.  UC Treatments / Results  Labs (all labs ordered are listed, but only abnormal results are displayed) Labs Reviewed - No data to display  EKG   Radiology DG Chest 2 View  Result Date: 10/01/2023 CLINICAL DATA:  Cough short of breath EXAM: CHEST - 2 VIEW COMPARISON:  09/16/2022 FINDINGS: Low lung volumes. Ill-defined left basilar opacity and minimal right upper lobe opacity. No pleural effusion. Normal cardiac size. No pneumothorax. Metallic fragments over the left shoulder. IMPRESSION: Low lung volumes with ill-defined left basilar and minimal right upper lobe opacity, possible pneumonia. Imaging follow-up to resolution is recommended Electronically Signed   By: Jasmine Pang M.D.   On: 10/01/2023 19:13    Procedures Procedures (including critical care time)  Medications Ordered in UC Medications - No data to display  Initial Impression / Assessment and Plan / UC Course  I have reviewed the triage vital signs and the nursing notes.  Pertinent labs & imaging results that were available during my care of the patient were reviewed by me and considered in my medical decision making (see chart for details).   77 year old female with history of asthma and hypertension presents for 1 month history of cough and congestion.  Seen 3 weeks ago in the ER for the symptoms and told she could have pneumonia.  Prescribed 1 week course of amoxicillin without relief.  Also given prednisone and has been taking Coricidin over-the-counter.  She denies fever but has had a chest tightness, shortness of breath, increased wheezing, increased fatigue and productive cough over the last couple weeks.  She is afebrile.  Ill-appearing but nontoxic.  No acute distress.  On exam she has few scattered inspiratory and expiratory wheezes and rales of the left lower  lobe.  Chest x-ray performed today shows suspicion for left lower lobe pneumonia.  Discussed with the patient.  Since she has tried amoxicillin has history of anaphylaxis to doxycycline and history of allergy to erythromycin will start her on Levaquin.  She also has comorbidity of hypertension and asthma so I feel this is appropriate.  Treating her again with prednisone.  Sent benzonatate for cough.  Patient was given a nebulizer machine and sent solution for albuterol to pharmacy.  Encouraged her to use the neb a few times throughout the day as needed.  Encouraged repeating x-ray in 3 to 4 weeks to ensure resolution of pneumonia.  Advised going to ED if fever, worsening of symptoms or no improvement in the next couple days.  Work note given.  Over read suggest left basilar and right upper lobe pneumonia.  Acute illness with systemic symptoms and flareup of chronic underlying disease.   Final Clinical Impressions(s) / UC Diagnoses   Final diagnoses:  Pneumonia of left lower lobe due to infectious organism  Exacerbation of asthma, unspecified asthma severity, unspecified whether persistent  Acute cough     Discharge Instructions      -Your x-ray suspicious for pneumonia.  I sent antibiotics to the pharmacy for you as well as cough medication, albuterol for the nebulizer and prednisone. - You need to repeat this x-ray in 3 to 4 weeks to make sure everything is cleared up. - If you begin to have a fever or worsening symptoms despite being on antibiotics you should go to the ER.     ED Prescriptions     Medication Sig Dispense Auth. Provider   levofloxacin (LEVAQUIN) 750 MG tablet Take 1 tablet (750 mg total) by mouth daily for 5 days. 5 tablet  Shirlee Latch, PA-C   predniSONE (DELTASONE) 20 MG tablet Take 2 tablets (40 mg total) by mouth daily for 5 days. 10 tablet Eusebio Friendly B, PA-C   benzonatate (TESSALON) 200 MG capsule Take 1 capsule (200 mg total) by mouth 3 (three) times  daily as needed. 30 capsule Eusebio Friendly B, PA-C   albuterol (PROVENTIL) (2.5 MG/3ML) 0.083% nebulizer solution Take 3 mLs (2.5 mg total) by nebulization every 6 (six) hours as needed for wheezing or shortness of breath. 75 mL Shirlee Latch, PA-C      PDMP not reviewed this encounter.   Shirlee Latch, PA-C 10/01/23 Ernestina [place]

## 2023-11-27 ENCOUNTER — Other Ambulatory Visit: Payer: Self-pay

## 2023-11-27 ENCOUNTER — Ambulatory Visit
Admission: EM | Admit: 2023-11-27 | Discharge: 2023-11-27 | Disposition: A | Discharge status: Home / Self Care | Payer: Medicare Other | Attending: Physician Assistant | Admitting: Physician Assistant

## 2023-11-27 ENCOUNTER — Ambulatory Visit (INDEPENDENT_AMBULATORY_CARE_PROVIDER_SITE_OTHER): Payer: Medicare Other

## 2023-11-27 DIAGNOSIS — J45901 Unspecified asthma with (acute) exacerbation: Secondary | ICD-10-CM

## 2023-11-27 DIAGNOSIS — I10 Essential (primary) hypertension: Secondary | ICD-10-CM

## 2023-11-27 DIAGNOSIS — R053 Chronic cough: Secondary | ICD-10-CM

## 2023-11-27 DIAGNOSIS — R0602 Shortness of breath: Secondary | ICD-10-CM

## 2023-11-27 DIAGNOSIS — R0789 Other chest pain: Secondary | ICD-10-CM

## 2023-11-27 MED ORDER — PROMETHAZINE-DM 6.25-15 MG/5ML PO SYRP
5.0000 mL | ORAL_SOLUTION | Freq: Four times a day (QID) | ORAL | 0 refills | Status: AC | PRN
Start: 2023-11-27 — End: ?

## 2023-11-27 MED ORDER — IPRATROPIUM-ALBUTEROL 0.5-2.5 (3) MG/3ML IN SOLN: 3.0000 mL | Freq: Four times a day (QID) | RESPIRATORY_TRACT | 0 refills | Status: AC | PRN

## 2023-11-27 MED ORDER — IPRATROPIUM-ALBUTEROL 0.5-2.5 (3) MG/3ML IN SOLN
3.0000 mL | Freq: Once | RESPIRATORY_TRACT | Status: AC
  Administered 2023-11-27: 3 mL via RESPIRATORY_TRACT

## 2023-11-27 MED ORDER — BUDESONIDE-FORMOTEROL FUMARATE 160-4.5 MCG/ACT IN AERO: 2.0000 | INHALATION_SPRAY | Freq: Two times a day (BID) | RESPIRATORY_TRACT | 1 refills | Status: AC | PRN

## 2023-11-27 MED ORDER — ALBUTEROL SULFATE HFA 108 (90 BASE) MCG/ACT IN AERS: 1.0000 | INHALATION_SPRAY | Freq: Four times a day (QID) | RESPIRATORY_TRACT | 1 refills | Status: AC | PRN

## 2023-11-27 MED ORDER — PREDNISONE 20 MG PO TABS
40.0000 mg | ORAL_TABLET | Freq: Every day | ORAL | 0 refills | Status: AC
Start: 2023-11-27 — End: 2023-12-02

## 2023-11-27 NOTE — Discharge Instructions (Addendum)
-  Your chest x-ray does not show any evidence of pneumonia. - Your symptoms are likely related to your asthma.  You may not be on the best management plan.  I have advised you to contact your PCP and inquire about a referral to a pulmonologist.  You should have pulmonary function testing.  It is possible you could have another type of lung disease such as interstitial lung disease complicating your symptoms. - I refilled your Symbicort and albuterol inhalers.  I sent DuoNeb solution for your nebulizer to the pharmacy.  Use either this or the plain albuterol every 4-6 hours as needed for shortness of breath but not both. - I sent cough medicine for you as well. - Increase your rest and fluids. - Use prednisone if needed. - Continue with your blood pressure medication.  Keep a log of blood pressure.  Follow-up with your primary care provider.  Try to contact them tomorrow for an appointment.

## 2023-11-27 NOTE — ED Triage Notes (Signed)
Pt c/o continued symptoms from pneumonia and covid in August.   Pt states that she recently had pneumonia and covid in August and she has not been right since.  Pt states that she believes her pneumonia has not cleared up and she is worried about it coming back  Pt has asthma

## 2023-11-27 NOTE — ED Provider Notes (Signed)
[facility]    CSN: [id number] Arrival date & time: 11/27/23  1720      History   Chief Complaint Chief Complaint  Patient presents with   Shortness of Breath   Cough    HPI Hailey Mann is a 77 y.o. female with history of asthma and hypertension.  She presents today for 3-week history of cough and congestion.  Symptoms have worsened over the past 2 days.   Denies fever.  Has had increased shortness of breath, chest tightness, fatigue.  Cough is productive of thick whitish-yellow sputum.  Denies weakness.  Has been using albuterol at home without relief.  Also uses Symbicort daily.  Does not have a pulmonologist.  Follows up with PCP for her asthma.  Had an appointment but had to cancel it due to schedule conflicts.    Seen by myself on 10/01/2023 and diagnosed with pneumonia.  She was placed on Levaquin at that time and symptoms got better.  Patient does report that she smoked about 45 years ago but has not smoked since.  No history of COPD, emphysema, interstitial lung disease and no report of autoimmune disease.  No other complaints.  HPI  Past Medical History:  Diagnosis Date   GERD (gastroesophageal reflux disease)    Hypertension     There are no active problems to display for this patient.   Past Surgical History:  Procedure Laterality Date   ABDOMINAL HYSTERECTOMY     BOWEL RESECTION     ECTOPIC PREGNANCY SURGERY      OB History   No obstetric history on file.      Home Medications    Prior to Admission medications   Medication Sig Start Date End Date Taking? Authorizing Provider  albuterol (VENTOLIN HFA) 108 (90 Base) MCG/ACT inhaler Inhale 1-2 puffs into the lungs every 6 (six) hours as needed for wheezing or shortness of breath. 11/27/23  Yes Shirlee Latch, PA-C  aspirin EC 81 MG tablet Take 1 tablet by mouth daily.   Yes [provider]  ipratropium-albuterol (DUONEB) 0.5-2.5 (3) MG/3ML SOLN Take 3 mLs by nebulization every 6  (six) hours as needed. 11/27/23  Yes Eusebio Friendly B, PA-C  lisinopril (ZESTRIL) 40 MG tablet Take 1 tablet by mouth daily. 07/22/23  Yes [provider]  lisinopril-hydrochlorothiazide (PRINZIDE,ZESTORETIC) 10-12.5 MG tablet Take 1 tablet by mouth daily.   Yes [provider]  Multiple Vitamin (MULTI-VITAMIN) tablet Take 1 tablet by mouth daily.   Yes [provider]  oxybutynin (DITROPAN-XL) 10 MG 24 hr tablet Take 1 tablet by mouth daily. 10/30/20  Yes [provider]  predniSONE (DELTASONE) 20 MG tablet Take 2 tablets (40 mg total) by mouth daily for 5 days. 11/27/23 12/02/23 Yes Shirlee Latch, PA-C  promethazine-dextromethorphan (PROMETHAZINE-DM) 6.25-15 MG/5ML syrup Take 5 mLs by mouth 4 (four) times daily as needed. 11/27/23  Yes Eusebio Friendly B, PA-C  verapamil (CALAN) 40 MG tablet Take 40 mg by mouth 3 (three) times daily.   Yes [provider]  budesonide-formoterol (SYMBICORT) 160-4.5 MCG/ACT inhaler Inhale 2 puffs into the lungs 2 (two) times daily as needed. 11/27/23   Shirlee Latch, PA-C    Family History Family History  Problem Relation Age of Onset   Gout Mother    Kidney disease Father     Social History Social History   Tobacco Use   Smoking status: Never   Smokeless tobacco: Never  Vaping Use   Vaping status: Never  Used  Substance Use Topics   Alcohol use: No   Drug use: No     Allergies   Aspirin, Codeine, Doxycycline, Morphine, Gabapentin, Acetaminophen-codeine, and Erythromycin   Review of Systems Review of Systems  Constitutional:  Positive for fatigue. Negative for chills, diaphoresis and fever.  HENT:  Positive for congestion. Negative for ear pain, rhinorrhea, sinus pressure, sinus pain and sore throat.   Respiratory:  Positive for cough, chest tightness, shortness of breath and wheezing.   Cardiovascular:  Negative for chest pain.  Gastrointestinal:  Negative for abdominal pain, nausea and vomiting.   Musculoskeletal:  Negative for arthralgias and myalgias.  Skin:  Negative for rash.  Neurological:  Positive for dizziness. Negative for weakness and headaches.  Hematological:  Negative for adenopathy.     Physical Exam Triage Vital Signs ED Triage Vitals  Encounter Vitals Group     BP      Systolic BP Percentile      Diastolic BP Percentile      Pulse      Resp      Temp      Temp src      SpO2      Weight      Height      Head Circumference      Peak Flow      Pain Score      Pain Loc      Pain Education      Exclude from Growth Chart    No data found.  Updated Vital Signs BP (!) 180/102 (BP Location: Left Arm)   Pulse (!) 101   Temp 98.4 F (36.9 C) (Oral)   Ht 5\' 5"  (1.651 m)   Wt 200 lb (90.7 kg)   SpO2 93%   BMI 33.28 kg/m      Physical Exam Vitals and nursing note reviewed.  Constitutional:      General: She is not in acute distress.    Appearance: Normal appearance. She is not ill-appearing or toxic-appearing.  HENT:     Head: Normocephalic and atraumatic.     Nose: Nose normal.     Mouth/Throat:     Mouth: Mucous membranes are moist.     Pharynx: Oropharynx is clear.  Eyes:     General: No scleral icterus.       Right eye: No discharge.        Left eye: No discharge.     Conjunctiva/sclera: Conjunctivae normal.  Cardiovascular:     Rate and Rhythm: Normal rate and regular rhythm.     Heart sounds: Normal heart sounds.  Pulmonary:     Effort: Pulmonary effort is normal. No respiratory distress.     Breath sounds: Normal breath sounds.  Musculoskeletal:     Cervical back: Neck supple.  Skin:    General: Skin is dry.  Neurological:     General: No focal deficit present.     Mental Status: She is alert. Mental status is at baseline.     Motor: No weakness.     Gait: Gait normal.  Psychiatric:        Mood and Affect: Mood normal.        Behavior: Behavior normal.        Thought Content: Thought content normal.      UC Treatments  / Results  Labs (all labs ordered are listed, but only abnormal results are displayed) Labs Reviewed - No data to display  EKG   Radiology DG  Chest 2 View Result Date: 11/27/2023 CLINICAL DATA:  Shortness of breath.  Recent pneumonia. EXAM: CHEST - 2 VIEW COMPARISON:  10/01/2023 FINDINGS: The heart size and mediastinal contours are within normal limits. Low lung volumes are again seen mild chronic pulmonary interstitial prominence is unchanged. No evidence of acute infiltrate or pleural effusion. IMPRESSION: Low lung volumes and mild chronic interstitial prominence. No active disease. Electronically Signed   By: Danae Orleans M.D.   On: 11/27/2023 18:47    Procedures ED EKG  Date/Time: 11/27/2023 7:45 PM  Performed by: Shirlee Latch, PA-C Authorized by: Shirlee Latch, PA-C   Interpretation:    Interpretation: abnormal   Rate:    ECG rate:  91   ECG rate assessment: normal   Rhythm:    Rhythm: sinus rhythm and A-V block     A-V block: 1st Degree   Ectopy:    Ectopy: none   QRS:    QRS axis:  Normal   QRS intervals:  Normal   QRS conduction: normal   ST segments:    ST segments:  Normal T waves:    T waves: normal   Other findings:    Other findings: LVH   Comments:     Sinus rhythm with sinus arrhythmia, first-degree AV block and LVH.  (including critical care time)  Medications Ordered in UC Medications  ipratropium-albuterol (DUONEB) 0.5-2.5 (3) MG/3ML nebulizer solution 3 mL (3 mLs Nebulization Given 11/27/23 1936)    Initial Impression / Assessment and Plan / UC Course  I have reviewed the triage vital signs and the nursing notes.  Pertinent labs & imaging results that were available during my care of the patient were reviewed by me and considered in my medical decision making (see chart for details).   77 year old female with history of asthma and hypertension presents for 3-week history of cough and congestion.  Seen 2 months ago and diagnosed with  pneumonia.  Given Levaquin.  She had COVID 2 months before that.  She says she has not been feeling well since having COVID 4 months ago.  She denies fever but has had a chest tightness, shortness of breath, increased wheezing, increased fatigue and productive cough over the last couple weeks.  Has been using albuterol and Symbicort.  BP 180/102.  Pulse 101.  Oxygen saturation 93%.  She is afebrile.  Ill-appearing but nontoxic.  No acute distress.  On exam she has few scattered inspiratory and expiratory wheezes.  Chest x-ray performed today shows no pneumonia.  Pneumonia previously seen has resolved.  It does show low lung volumes and mild chronic interstitial prominence.  Reviewed all results with patient.  Encouraged her to follow-up with a pulmonologist given her continued asthma exacerbation.  Patient given DuoNeb treatment in clinic for asthma exacerbation.  Treating her again with prednisone.  Sent Promethazine DM as needed for severe cough.  Patient was given a nebulizer machine at previous visit and I sent DuoNeb solution to pharmacy for her.  Also refilled Symbicort and albuterol.  Encouraged her to use the neb a few times throughout the day as needed.   Advised going to ED if fever, worsening of symptoms or no improvement in the next couple days.   EKG today shows sinus rhythm with marked sinus arrhyhtmia with first degree AV block.  BP is 180/102.  She is encouraged to keep a log of blood pressure and follow-up with primary care provider.  May need to have medications adjusted.  Patient with  continued flareup of chronic underlying disease.   Final Clinical Impressions(s) / UC Diagnoses   Final diagnoses:  Shortness of breath  Exacerbation of persistent asthma, unspecified asthma severity  Chronic cough  Essential hypertension     Discharge Instructions      -Your chest x-ray does not show any evidence of pneumonia. - Your symptoms are likely related to your asthma.  You may  not be on the best management plan.  I have advised you to contact your PCP and inquire about a referral to a pulmonologist.  You should have pulmonary function testing.  It is possible you could have another type of lung disease such as interstitial lung disease complicating your symptoms. - I refilled your Symbicort and albuterol inhalers.  I sent DuoNeb solution for your nebulizer to the pharmacy.  Use either this or the plain albuterol every 4-6 hours as needed for shortness of breath but not both. - I sent cough medicine for you as well. - Increase your rest and fluids. - Use prednisone if needed. - Continue with your blood pressure medication.  Keep a log of blood pressure.  Follow-up with your primary care provider.  Try to contact them tomorrow for an appointment.      ED Prescriptions     Medication Sig Dispense Auth. Provider   budesonide-formoterol (SYMBICORT) 160-4.5 MCG/ACT inhaler Inhale 2 puffs into the lungs 2 (two) times daily as needed. 1 each Shirlee Latch, PA-C   albuterol (VENTOLIN HFA) 108 (90 Base) MCG/ACT inhaler Inhale 1-2 puffs into the lungs every 6 (six) hours as needed for wheezing or shortness of breath. 1 g Eusebio Friendly B, PA-C   ipratropium-albuterol (DUONEB) 0.5-2.5 (3) MG/3ML SOLN Take 3 mLs by nebulization every 6 (six) hours as needed. 360 mL Eusebio Friendly B, PA-C   predniSONE (DELTASONE) 20 MG tablet Take 2 tablets (40 mg total) by mouth daily for 5 days. 10 tablet Shirlee Latch, PA-C   promethazine-dextromethorphan (PROMETHAZINE-DM) 6.25-15 MG/5ML syrup Take 5 mLs by mouth 4 (four) times daily as needed. 118 mL Shirlee Latch, PA-C      PDMP not reviewed this encounter.      Shirlee Latch, PA-C 11/27/23 1948
